# Patient Record
Sex: Male | Born: 2000 | Race: White | Hispanic: No | Marital: Single | Smoking: Never smoker
Health system: Southern US, Community
[De-identification: ages and names within clinical notes are randomized; demographics above are authoritative.]

## PROBLEM LIST (undated history)

## (undated) DIAGNOSIS — J45990 Exercise induced bronchospasm: Secondary | ICD-10-CM

## (undated) DIAGNOSIS — Q677 Pectus carinatum: Secondary | ICD-10-CM

## (undated) DIAGNOSIS — R011 Cardiac murmur, unspecified: Secondary | ICD-10-CM

## (undated) HISTORY — DX: Cardiac murmur, unspecified: R01.1

## (undated) HISTORY — DX: Pectus carinatum: Q67.7

## (undated) HISTORY — DX: Exercise induced bronchospasm: J45.990

## (undated) HISTORY — PX: MYRINGOTOMY WITH TUBE PLACEMENT: SHX5663

---
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
## 2018-02-24 ENCOUNTER — Encounter: Payer: Self-pay | Admitting: Family Medicine

## 2018-02-24 ENCOUNTER — Ambulatory Visit: Payer: Managed Care, Other (non HMO) | Admitting: Family Medicine

## 2018-02-24 VITALS — BP 108/68 | HR 103 | Temp 99.1°F | Resp 18 | Ht 71.0 in | Wt 154.1 lb

## 2018-02-24 DIAGNOSIS — W57XXXA Bitten or stung by nonvenomous insect and other nonvenomous arthropods, initial encounter: Secondary | ICD-10-CM | POA: Diagnosis not present

## 2018-02-24 DIAGNOSIS — J0100 Acute maxillary sinusitis, unspecified: Secondary | ICD-10-CM | POA: Diagnosis not present

## 2018-02-24 MED ORDER — FLUTICASONE PROPIONATE 50 MCG/ACT NA SUSP: 2.0000 | Freq: Every day | NASAL | 6 refills | Status: AC

## 2018-02-24 MED ORDER — DOXYCYCLINE HYCLATE 100 MG PO TABS: 100.0000 mg | ORAL_TABLET | Freq: Two times a day (BID) | ORAL | 0 refills | Status: AC

## 2018-02-24 NOTE — Patient Instructions (Signed)
Take 600mg  (3 tablets) of motrin every 8 hours as needed for fevers, headache, or bodyaches. May also take 1000mg  Tylenol every 8 hours as needed.

## 2018-02-24 NOTE — Progress Notes (Signed)
Name: Larry Sharp   MRN: [id number]    DOB: June 07, 2001   Date:02/24/2018       Progress Note  Subjective  Chief Complaint  Chief Complaint  Patient presents with  . Fever     nausea, bodyaches, headache, cough for 3 days    HPI  Pt presents with concern for illness for 3 days - started with body aches and headache; having dry cough, low-grade fever, nausea without vomiting/diarrhea/constipation, some dizziness, sinus tenderness but only mild congestion.  Taking motrin - without relief of symptoms.  Denies sore throat, chest pain, shortness of breath, rashes. No recent sick contacts, no recent travel; does note he has pulled a few ticks off of him but none were embedded.  Patient Active Problem List   Diagnosis Date Noted  . Pre-school health examination 02/20/2017  . Low HDL (under 40) 09/04/2016  . Neutropenia (HCC) 07/26/2016  . Abnormal thyroid blood test 07/26/2016  . Syncope and collapse 07/17/2016  . Pyrexia 07/03/2015  . Acute back pain with sciatica 06/01/2015  . Epicondylitis elbow, medial 06/01/2015  . Elbow pain 06/01/2015  . Cardiac murmur 05/05/2015  . Pectus carinatum 05/05/2015  . Numbness and tingling of right arm 05/05/2015  . History of arm fracture 05/05/2015  . Acne vulgaris 05/05/2015    Social History   Tobacco Use  . Smoking status: Never Smoker  . Smokeless tobacco: Never Used  Substance Use Topics  . Alcohol use: No    Alcohol/week: 0.0 oz    Current Outpatient Medications:  .  clindamycin (CLEOCIN-T) 1 % external solution, Apply topically 2 (two) times daily. To affected skin (Patient not taking: Reported on 02/24/2018), Disp: 60 mL, Rfl: 5 .  promethazine-dextromethorphan (PROMETHAZINE-DM) 6.25-15 MG/5ML syrup, TAKE 5 MLS( 1 TEASPOONFUL) BY MOUTH EVERY 4-6 HOURS AS NEEDED, Disp: , Rfl: 0 .  tretinoin (RETIN-A) 0.025 % cream, Apply topically at bedtime. (Patient not taking: Reported on 02/24/2018), Disp: 45 g, Rfl: 5  No Known  Allergies  ROS  Ten systems reviewed and is negative except as mentioned in HPI  Objective  Vitals:   02/24/18 1023  BP: 108/68  Pulse: (!) 103  Resp: 18  Temp: 99.1 F (37.3 C)  TempSrc: Oral  SpO2: 94%  Weight: 154 lb 1.6 oz (69.9 kg)  Height: 5\' 11"  (1.803 m)   Body mass index is 21.49 kg/m.  Nursing Note and Vital Signs reviewed.  Physical Exam  Constitutional: He is oriented to person, place, and time. He appears well-developed and well-nourished.  HENT:  Head: Normocephalic and atraumatic.  Right Ear: Tympanic membrane, external ear and ear canal normal.  Left Ear: Tympanic membrane, external ear and ear canal normal.  Nose: Rhinorrhea present. Right sinus exhibits maxillary sinus tenderness. Left sinus exhibits maxillary sinus tenderness and frontal sinus tenderness.  Mouth/Throat: Mucous membranes are normal. No oropharyngeal exudate, posterior oropharyngeal edema, posterior oropharyngeal erythema or tonsillar abscesses. Tonsils are 0 on the right. Tonsils are 0 on the left. No tonsillar exudate.  Eyes: Pupils are equal, round, and reactive to light. Conjunctivae and EOM are normal. No scleral icterus.  Neck: Normal range of motion. Neck supple.  Cardiovascular: Normal rate, regular rhythm and normal heart sounds.  Pulmonary/Chest: Effort normal and breath sounds normal.  Musculoskeletal: Normal range of motion. He exhibits no edema.  Neurological: He is alert and oriented to person, place, and time. No cranial nerve deficit.  Skin: Skin is warm and dry. No rash noted. No erythema.  Psychiatric: He has a normal mood and affect. His behavior is normal. Judgment and thought content normal.  Nursing note and vitals reviewed.  No results found for this or any previous visit (from the past 72 hour(s)).  Assessment & Plan  1. Acute non-recurrent maxillary sinusitis - doxycycline (VIBRA-TABS) 100 MG tablet; Take 1 tablet (100 mg total) by mouth 2 (two) times daily for  14 days.  Dispense: 28 tablet; Refill: 0 - fluticasone (FLONASE) 50 MCG/ACT nasal spray; Place 2 sprays into both nostrils daily.  Dispense: 16 g; Refill: 6  2. Tick bite, initial encounter - doxycycline (VIBRA-TABS) 100 MG tablet; Take 1 tablet (100 mg total) by mouth 2 (two) times daily for 14 days.  Dispense: 28 tablet; Refill: 0  -Red flags and when to present for emergency care or RTC including fever >101.27F, chest pain, shortness of breath, new/worsening/un-resolving symptoms, pain with ocular movement, reviewed with patient at time of visit. Follow up and care instructions discussed and provided in AVS.  - Work note provided to return once temp <99.0
